# Patient Record
Sex: Male | Born: 1995 | Race: Black or African American | Hispanic: No | Marital: Single | Smoking: Never smoker
Health system: Southern US, Community
[De-identification: ages and names within clinical notes are randomized; demographics above are authoritative.]

---
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
## 2016-11-24 ENCOUNTER — Ambulatory Visit (HOSPITAL_COMMUNITY)
Admission: EM | Admit: 2016-11-24 | Discharge: 2016-11-24 | Disposition: A | Discharge status: Home / Self Care | Payer: Medicaid Other | Attending: Family Medicine | Admitting: Family Medicine

## 2016-11-24 ENCOUNTER — Encounter (HOSPITAL_COMMUNITY): Payer: Self-pay | Admitting: Emergency Medicine

## 2016-11-24 DIAGNOSIS — A5409 Other gonococcal infection of lower genitourinary tract: Secondary | ICD-10-CM | POA: Diagnosis not present

## 2016-11-24 DIAGNOSIS — R300 Dysuria: Secondary | ICD-10-CM | POA: Diagnosis present

## 2016-11-24 DIAGNOSIS — A64 Unspecified sexually transmitted disease: Secondary | ICD-10-CM

## 2016-11-24 DIAGNOSIS — R369 Urethral discharge, unspecified: Secondary | ICD-10-CM

## 2016-11-24 DIAGNOSIS — A5601 Chlamydial cystitis and urethritis: Secondary | ICD-10-CM | POA: Insufficient documentation

## 2016-11-24 LAB — POCT URINALYSIS DIP (DEVICE)
Bilirubin Urine: NEGATIVE
GLUCOSE, UA: NEGATIVE mg/dL
Hgb urine dipstick: NEGATIVE
Ketones, ur: NEGATIVE mg/dL
Leukocytes, UA: NEGATIVE
NITRITE: NEGATIVE
PROTEIN: NEGATIVE mg/dL
Specific Gravity, Urine: 1.01 (ref 1.005–1.030)
UROBILINOGEN UA: 0.2 mg/dL (ref 0.0–1.0)
pH: 7 (ref 5.0–8.0)

## 2016-11-24 MED ORDER — CEFTRIAXONE SODIUM 250 MG IJ SOLR
250.0000 mg | Freq: Once | INTRAMUSCULAR | Status: AC
  Administered 2016-11-24: 250 mg via INTRAMUSCULAR

## 2016-11-24 MED ORDER — LIDOCAINE HCL (PF) 1 % IJ SOLN
INTRAMUSCULAR | Status: AC
  Filled 2016-11-24: qty 2

## 2016-11-24 MED ORDER — AZITHROMYCIN 250 MG PO TABS
1000.0000 mg | ORAL_TABLET | Freq: Once | ORAL | Status: AC
  Administered 2016-11-24: 1000 mg via ORAL

## 2016-11-24 MED ORDER — AZITHROMYCIN 250 MG PO TABS
ORAL_TABLET | ORAL | Status: AC
  Filled 2016-11-24: qty 4

## 2016-11-24 MED ORDER — CEFTRIAXONE SODIUM 250 MG IJ SOLR
INTRAMUSCULAR | Status: AC
  Filled 2016-11-24: qty 250

## 2016-11-24 NOTE — Discharge Instructions (Signed)
Your urine has been collected and will be tested for gonorrhea and chlamydia. You are being treated tonight with medications that cover for those infections. Practice safe sex methods and I recommend following up with your primary care provider, the health department or returning to clinic in 1 week for retesting to ensure clearance of the infection.

## 2016-11-24 NOTE — ED Triage Notes (Signed)
[facility] with a complaint of abdominal pain, dysuria and a penile discharge that started today.

## 2016-11-24 NOTE — ED Notes (Signed)
Dirty and clean urine collected. 

## 2016-11-24 NOTE — ED Provider Notes (Signed)
CSN: [id number]     Arrival date & time 11/24/16  1734 History   First MD Initiated Contact with Patient 11/24/16 1815     Chief Complaint  Patient presents with  . Dysuria   (Consider location/radiation/quality/duration/timing/severity/associated sxs/prior Treatment) 21 year old male presents to clinic with a 1 day history of burning with urination and a thick yellow discharge coming from his penis. States he was last intimate with his partner two days ago, has not used condoms, denies fever, nausea, vomiting, but does have abdominal pain. He denies any lesions, irritation, or other symptoms on his genitals.      History reviewed. No pertinent past medical history. History reviewed. No pertinent surgical history. History reviewed. No pertinent family history. Social History  Substance Use Topics  . Smoking status: Never Smoker  . Smokeless tobacco: Not on file  . Alcohol use No    Review of Systems  Constitutional: Negative.   HENT: Negative.   Eyes: Negative.   Respiratory: Negative.   Cardiovascular: Negative.   Gastrointestinal: Positive for abdominal pain. Negative for diarrhea, nausea and vomiting.  Genitourinary: Positive for discharge and dysuria. Negative for flank pain, genital sores, penile pain, scrotal swelling, testicular pain and urgency.  Musculoskeletal: Negative.   Neurological: Negative.     Allergies  Patient has no known allergies.  Home Medications   Prior to Admission medications   Not on File   Meds Ordered and Administered this Visit   Medications  cefTRIAXone (ROCEPHIN) injection 250 mg (250 mg Intramuscular Given 11/24/16 1845)  azithromycin (ZITHROMAX) tablet 1,000 mg (1,000 mg Oral Given 11/24/16 1843)    BP 118/71 (BP Location: Right Arm)   Pulse 73   Temp 98.5 F (36.9 C) (Oral)   Resp 16   SpO2 98%  No data found.   Physical Exam  Constitutional: He is oriented to person, place, and time. He appears well-developed and  well-nourished. No distress.  HENT:  Head: Normocephalic.  Cardiovascular: Normal rate and regular rhythm.   Pulmonary/Chest: Effort normal and breath sounds normal.  Abdominal: Soft. Bowel sounds are normal. He exhibits no distension. There is no guarding.  Genitourinary:  Genitourinary Comments: Deferred at patient's request  Neurological: He is alert and oriented to person, place, and time.  Skin: Skin is warm and dry. Capillary refill takes less than 2 seconds. He is not diaphoretic.  Psychiatric: He has a normal mood and affect.  Nursing note and vitals reviewed.   Urgent Care Course   Clinical Course     Procedures (including critical care time)  Labs Review Labs Reviewed  POCT URINALYSIS DIP (DEVICE)  URINE CYTOLOGY ANCILLARY ONLY    Imaging Review No results found.   Visual Acuity Review  Right Eye Distance:   Left Eye Distance:   Bilateral Distance:    Right Eye Near:   Left Eye Near:    Bilateral Near:         MDM   1. STI (sexually transmitted infection)    Urine collected to test for GC/[place]. Patient treated in clinic based on symptoms with Azithromycin and Rocephin. Patient counseled to practice safe sex methods to follow up in one week at his provider, health department, or to return to clinic for retesting. Patient offered HIV screening and declined.      Dorena BodoLawrence Brylea Pita, NP 11/24/16 [phone]

## 2016-11-25 LAB — URINE CYTOLOGY ANCILLARY ONLY
Chlamydia: POSITIVE — AB
Neisseria Gonorrhea: POSITIVE — AB

## 2021-03-14 ENCOUNTER — Emergency Department (HOSPITAL_COMMUNITY)
Admission: EM | Admit: 2021-03-14 | Discharge: 2021-03-15 | Discharge status: Against Medical Advice | Payer: BLUE CROSS/BLUE SHIELD | Attending: Emergency Medicine | Admitting: Emergency Medicine

## 2021-03-14 ENCOUNTER — Encounter (HOSPITAL_COMMUNITY): Payer: Self-pay

## 2021-03-14 ENCOUNTER — Other Ambulatory Visit: Payer: Self-pay

## 2021-03-14 ENCOUNTER — Emergency Department (HOSPITAL_COMMUNITY): Payer: BLUE CROSS/BLUE SHIELD

## 2021-03-14 DIAGNOSIS — R0602 Shortness of breath: Secondary | ICD-10-CM | POA: Insufficient documentation

## 2021-03-14 DIAGNOSIS — R079 Chest pain, unspecified: Secondary | ICD-10-CM | POA: Diagnosis not present

## 2021-03-14 DIAGNOSIS — Z5321 Procedure and treatment not carried out due to patient leaving prior to being seen by health care provider: Secondary | ICD-10-CM | POA: Insufficient documentation

## 2021-03-14 LAB — TROPONIN I (HIGH SENSITIVITY): Troponin I (High Sensitivity): 3 ng/L (ref <18)

## 2021-03-14 LAB — CBC WITH DIFFERENTIAL/PLATELET
Abs Immature Granulocytes: 0.02 10*3/uL (ref 0.00–0.07)
Basophils Absolute: 0 10*3/uL (ref 0.0–0.1)
Basophils Relative: 0 %
Eosinophils Absolute: 0.3 10*3/uL (ref 0.0–0.5)
Eosinophils Relative: 3 %
HCT: 45.2 % (ref 39.0–52.0)
Hemoglobin: 15 g/dL (ref 13.0–17.0)
Immature Granulocytes: 0 %
Lymphocytes Relative: 38 %
Lymphs Abs: 3.4 10*3/uL (ref 0.7–4.0)
MCH: 28.8 pg (ref 26.0–34.0)
MCHC: 33.2 g/dL (ref 30.0–36.0)
MCV: 86.9 fL (ref 80.0–100.0)
Monocytes Absolute: 0.7 10*3/uL (ref 0.1–1.0)
Monocytes Relative: 8 %
Neutro Abs: 4.5 10*3/uL (ref 1.7–7.7)
Neutrophils Relative %: 51 %
Platelets: 222 10*3/uL (ref 150–400)
RBC: 5.2 MIL/uL (ref 4.22–5.81)
RDW: 12.5 % (ref 11.5–15.5)
WBC: 8.8 10*3/uL (ref 4.0–10.5)
nRBC: 0 % (ref 0.0–0.2)

## 2021-03-14 LAB — BASIC METABOLIC PANEL
Anion gap: 7 (ref 5–15)
BUN: 10 mg/dL (ref 6–20)
CO2: 28 mmol/L (ref 22–32)
Calcium: 9.3 mg/dL (ref 8.9–10.3)
Chloride: 105 mmol/L (ref 98–111)
Creatinine, Ser: 1.18 mg/dL (ref 0.61–1.24)
GFR, Estimated: >60 mL/min (ref >60)
Glucose, Bld: 85 mg/dL (ref 70–99)
Potassium: 4 mmol/L (ref 3.5–5.1)
Sodium: 140 mmol/L (ref 135–145)

## 2021-03-14 NOTE — ED Triage Notes (Signed)
Emergency Medicine Provider Triage Evaluation Note  Edwin Adams , a 25 y.o. male  was evaluated in triage.  Pt complains of right-sided chest pain.  Patient was 2 days patient reports pain is only present when he coughs, takes a deep breath, or uses nicotine vape.  Patient endorses associated shortness of breath.    Review of Systems  Positive: Right-sided chest pain,shob Negative: Fever, chills, nausea, nasal congestion, sore throat, nausea, vomiting diaphoresis, abdominal  Physical Exam  BP 137/75 (BP Location: Right Arm)   Pulse 88   Temp 99.2 F (37.3 C) (Oral)   Resp 17   Ht 6\' 2"  (1.88 m)   Wt 108.9 kg   SpO2 99%   BMI 30.81 kg/m  Gen:   Awake, no distress   HEENT:  Atraumatic  Resp:  Normal effort, lungs clear to auscultation, Cardiac:  Normal rate  MSK:   Moves extremities without difficulty  Neuro:  Speech clear   Medical Decision Making  Medically screening exam initiated at 9:04 PM.  Appropriate orders placed.  was informed that the remainder of the evaluation will be completed by another provider, this initial triage assessment does not replace that evaluation, and the importance of remaining in the ED until their evaluation is complete.  Clinical Impression   The patient appears stable so that the remainder of the work up may be completed by another provider.      Caleb Popp, Haskel Schroeder 03/14/21 2106

## 2021-03-14 NOTE — ED Triage Notes (Signed)
Patient reports chest pain and shortness of breath x a few days, R sided chest pain worse with coughing, states he does vape.

## 2021-03-15 NOTE — ED Notes (Signed)
Pt called for vitals no answer. °

## 2021-09-20 IMAGING — DX DG CHEST 2V
2 series · 2 of 2 positions shown · non-contrast
Comparison: None.

CLINICAL DATA: Right-sided chest pain, cough, and shortness of
breath for several days.

EXAM:
CHEST - 2 VIEW

[chest pa]
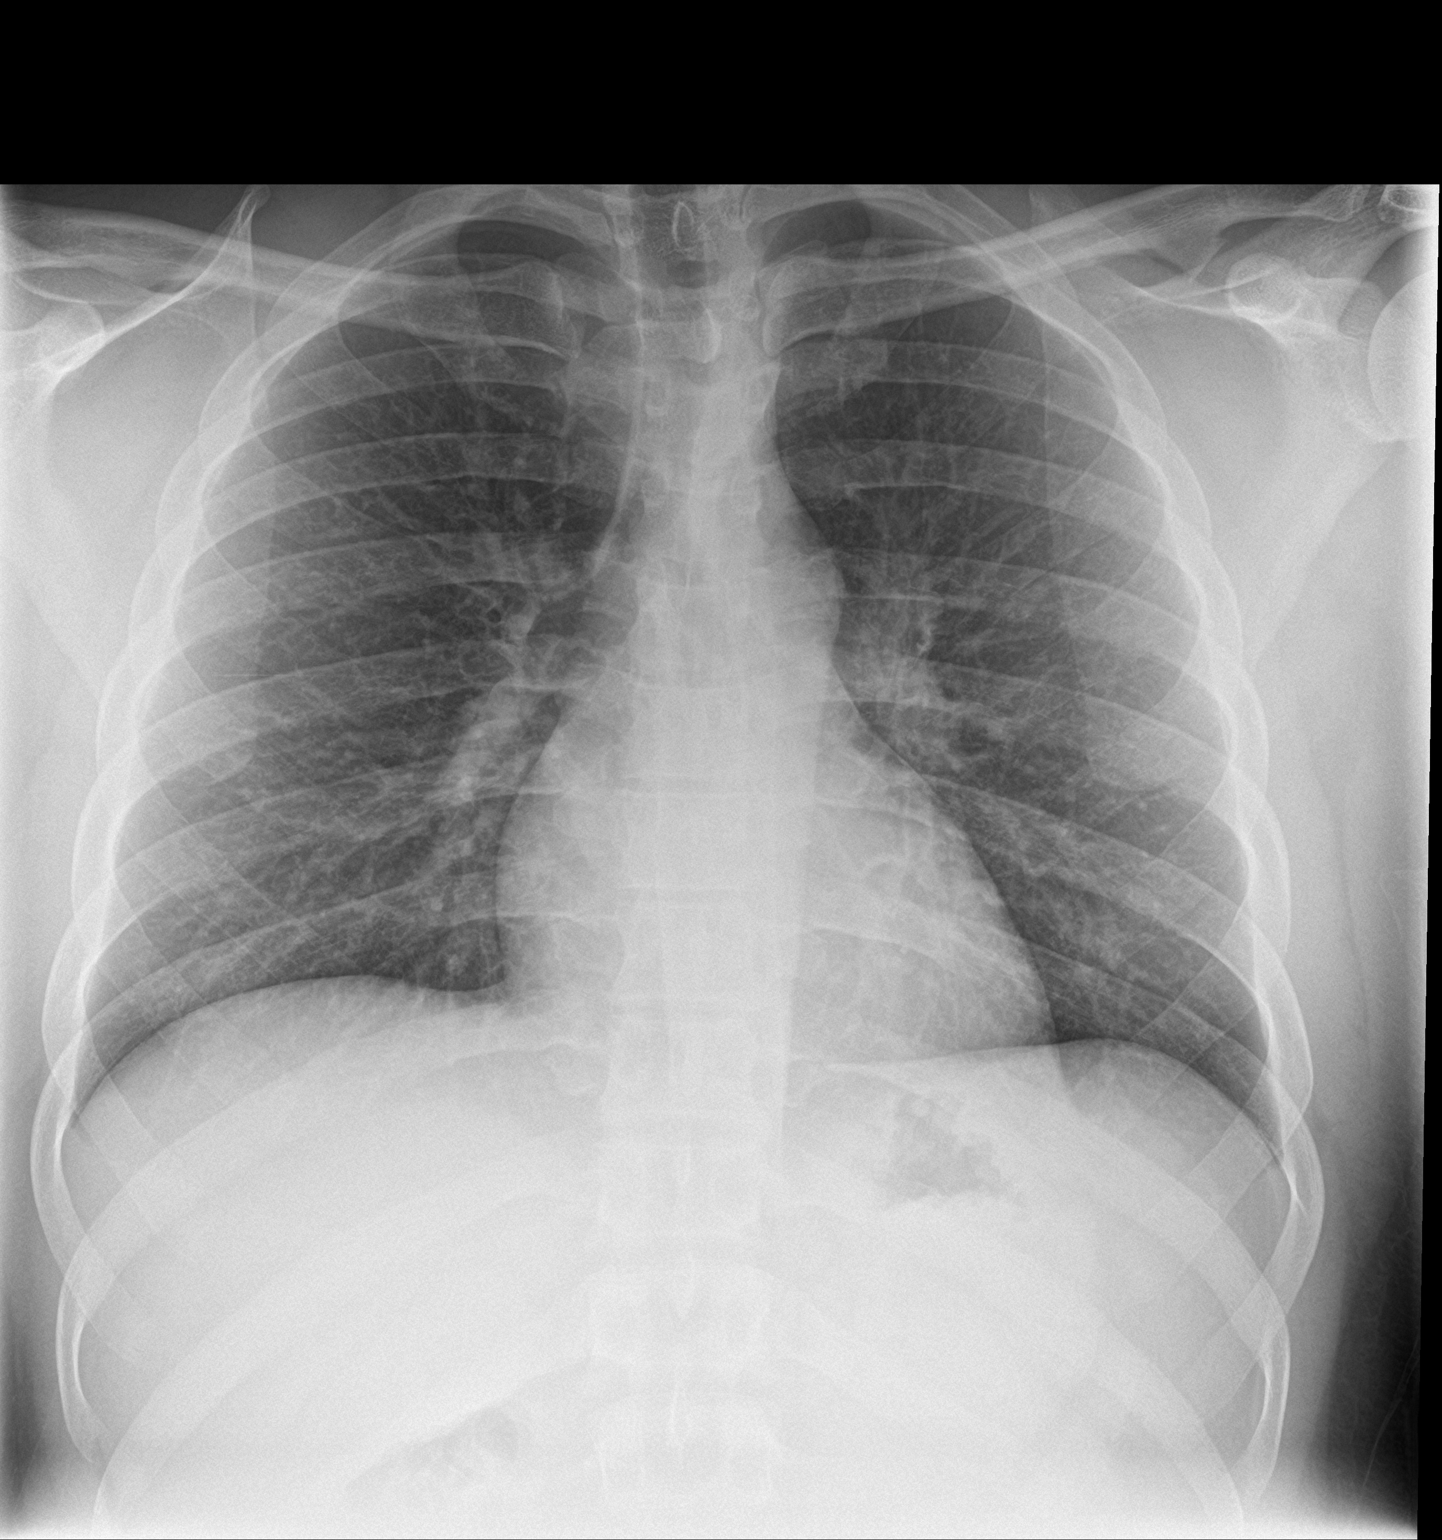

[chest lat]
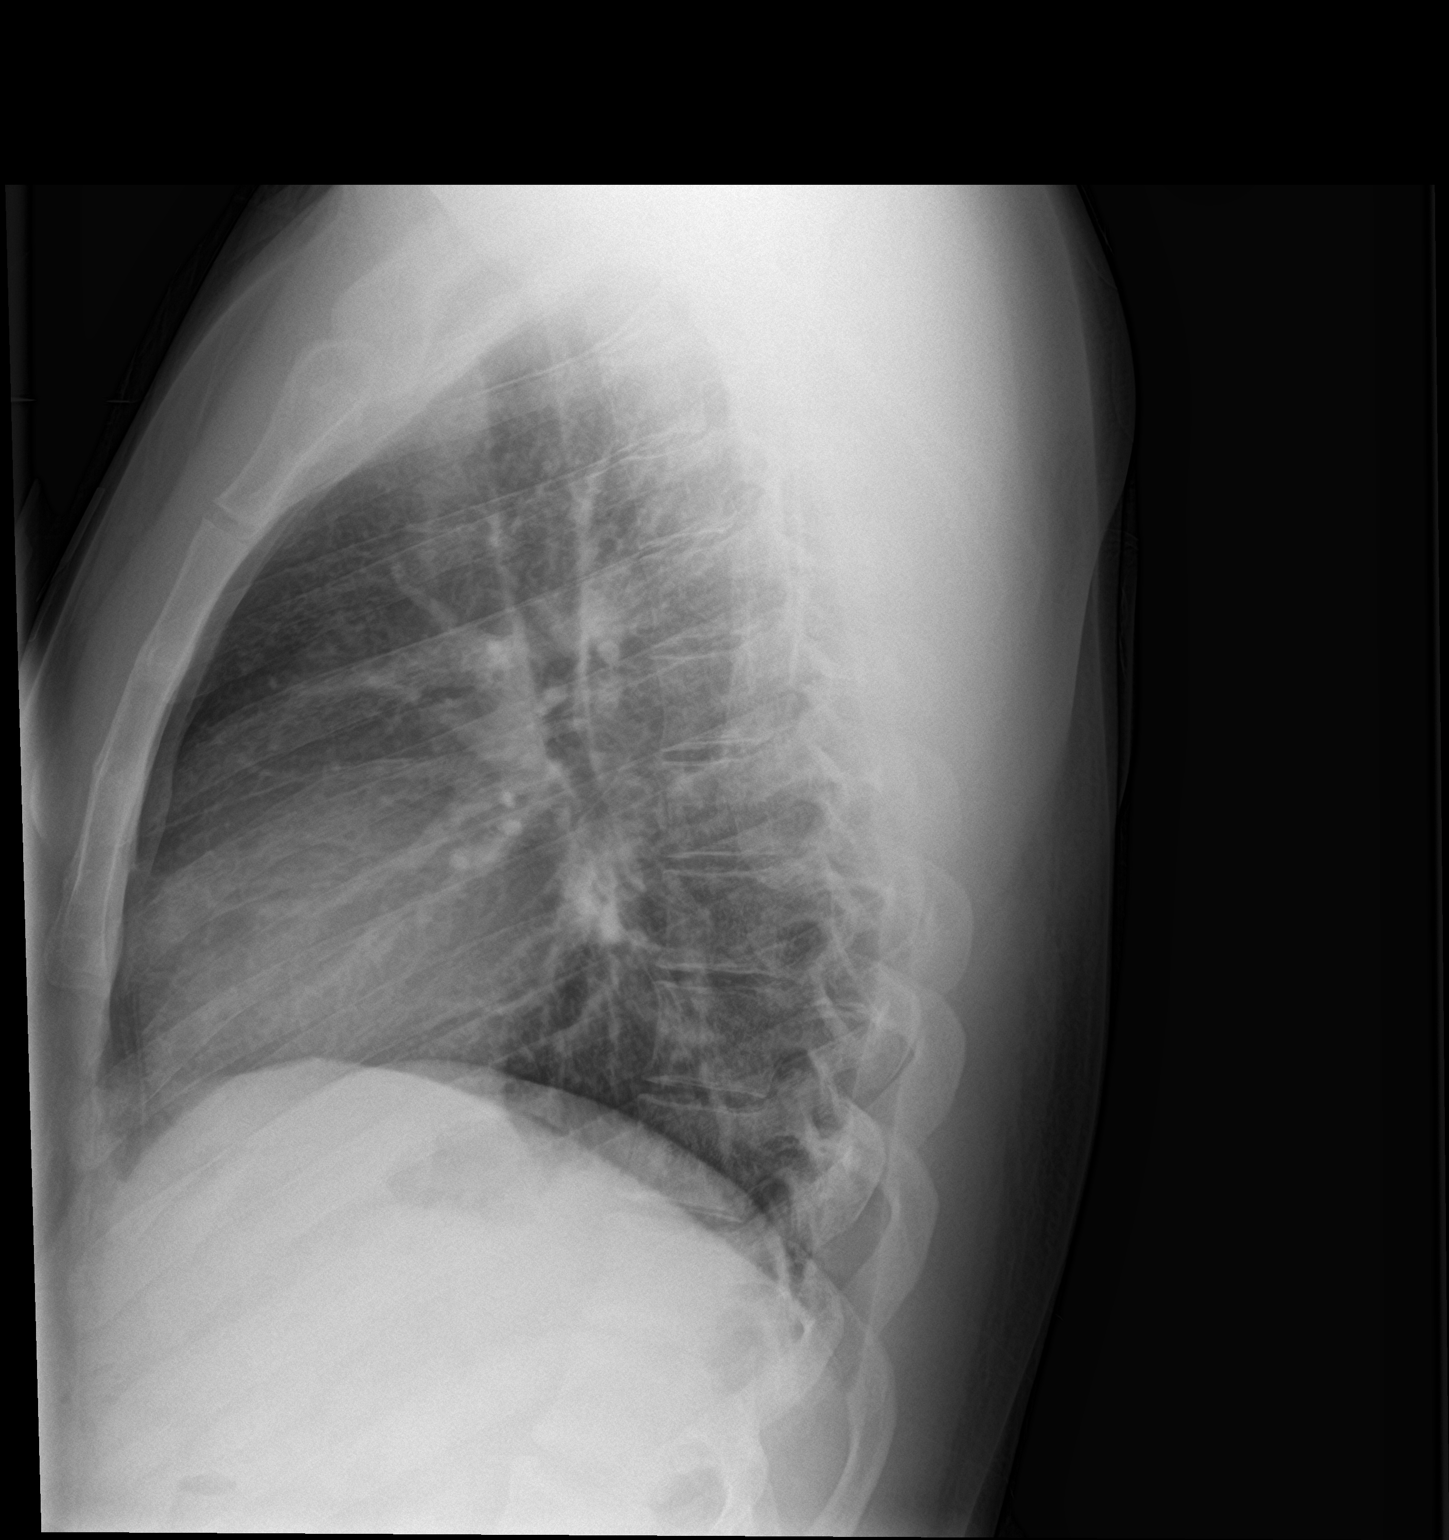

[2 of 2 positions shown; findings below may reference images not displayed]

FINDINGS: The heart size and mediastinal contours are within normal limits.
Both lungs are clear. No evidence of pneumothorax or pleural
effusion. The visualized skeletal structures are unremarkable.
IMPRESSION: Normal study.

## 2021-10-31 ENCOUNTER — Encounter (HOSPITAL_COMMUNITY): Payer: Self-pay

## 2021-10-31 ENCOUNTER — Telehealth (HOSPITAL_COMMUNITY): Payer: Self-pay

## 2021-10-31 ENCOUNTER — Ambulatory Visit (HOSPITAL_COMMUNITY)
Admission: EM | Admit: 2021-10-31 | Discharge: 2021-10-31 | Disposition: A | Discharge status: Home / Self Care | Payer: BC Managed Care – PPO | Attending: Urgent Care | Admitting: Urgent Care

## 2021-10-31 DIAGNOSIS — R369 Urethral discharge, unspecified: Secondary | ICD-10-CM | POA: Insufficient documentation

## 2021-10-31 DIAGNOSIS — Z7251 High risk heterosexual behavior: Secondary | ICD-10-CM

## 2021-10-31 MED ORDER — DOXYCYCLINE HYCLATE 100 MG PO CAPS: 100.0000 mg | ORAL_CAPSULE | Freq: Two times a day (BID) | ORAL | 0 refills | Status: DC

## 2021-10-31 MED ORDER — CEFTRIAXONE SODIUM 500 MG IJ SOLR
INTRAMUSCULAR | Status: AC
  Filled 2021-10-31: qty 500

## 2021-10-31 MED ORDER — CEFTRIAXONE SODIUM 500 MG IJ SOLR
500.0000 mg | Freq: Once | INTRAMUSCULAR | Status: AC
  Administered 2021-10-31: 500 mg via INTRAMUSCULAR

## 2021-10-31 MED ORDER — LIDOCAINE HCL (PF) 1 % IJ SOLN
INTRAMUSCULAR | Status: AC
  Filled 2021-10-31: qty 2

## 2021-10-31 NOTE — ED Triage Notes (Signed)
Pt c/o lower abdominal pain and penial discharge x2 days.

## 2021-10-31 NOTE — ED Provider Notes (Signed)
  [facility]   MRN: [id number] DOB: 11/03/1996  Subjective:   FODAY CONE is a 25 y.o. male presenting for 2-day history of acute onset penile discharge, lower abdominal pain. Had unprotected oral sex 3 days ago. Generally uses condoms for protection. Has several male partners. Denies dysuria, hematuria, urinary frequency, penile swelling, testicular pain, testicular swelling, anal pain, groin pain.   No current facility-administered medications for this encounter. No current outpatient medications on file.   No Known Allergies  History reviewed. No pertinent past medical history.   History reviewed. No pertinent surgical history.  History reviewed. No pertinent family history.  Social History   Tobacco Use   Smoking status: Never  Substance Use Topics   Alcohol use: No    ROS   Objective:   Vitals: BP 117/74 (BP Location: Right Arm)   Pulse 83   Temp 98.4 F (36.9 C) (Oral)   SpO2 100%   Physical Exam Constitutional:      General: He is not in acute distress.    Appearance: Normal appearance. He is well-developed and normal weight. He is not ill-appearing, toxic-appearing or diaphoretic.  HENT:     Head: Normocephalic and atraumatic.     Right Ear: External ear normal.     Left Ear: External ear normal.     Nose: Nose normal.     Mouth/Throat:     Pharynx: Oropharynx is clear.  Eyes:     General: No scleral icterus.       Right eye: No discharge.        Left eye: No discharge.     Extraocular Movements: Extraocular movements intact.     Pupils: Pupils are equal, round, and reactive to light.  Cardiovascular:     Rate and Rhythm: Normal rate.  Pulmonary:     Effort: Pulmonary effort is normal.  Genitourinary:    Penis: Circumcised. Discharge present.   Musculoskeletal:     Cervical back: Normal range of motion.  Neurological:     Mental Status: He is alert and oriented to person, place, and time.  Psychiatric:        Mood  and Affect: Mood normal.        Behavior: Behavior normal.        Thought Content: Thought content normal.        Judgment: Judgment normal.    Assessment and Plan :   PDMP not reviewed this encounter.  1. Penile discharge   2. Unprotected sex    Patient treated empirically as per CDC guidelines with IM ceftriaxone, doxycycline as an outpatient.  Labs pending.   Counseled on safe sex practices including abstaining for 1 week following treatment.  Counseled patient on potential for adverse effects with medications prescribed/recommended today, ER and return-to-clinic precautions discussed, patient verbalized understanding.    Wallis Bamberg, [place] 10/31/21 [phone]

## 2021-10-31 NOTE — Discharge Instructions (Addendum)
Avoid all forms of sexual intercourse (oral, vaginal, anal) for the next 7 days to avoid spreading/reinfecting or at least until we can see what kinds of infection results are positive. Return if symptoms worsen/do not resolve, you develop fever, abdominal pain, blood in your urine, or are re-exposed to a sexually transmitted infection (STI).  

## 2021-11-03 LAB — CYTOLOGY, (ORAL, ANAL, URETHRAL) ANCILLARY ONLY
Chlamydia: NEGATIVE
Comment: NEGATIVE
Comment: NEGATIVE
Comment: NORMAL
Neisseria Gonorrhea: NEGATIVE
Trichomonas: NEGATIVE

## 2022-02-21 ENCOUNTER — Ambulatory Visit
Admission: EM | Admit: 2022-02-21 | Discharge: 2022-02-21 | Disposition: A | Discharge status: Home / Self Care | Payer: Medicare Other | Attending: Family Medicine | Admitting: Family Medicine

## 2022-02-21 ENCOUNTER — Encounter: Payer: Self-pay | Admitting: Emergency Medicine

## 2022-02-21 DIAGNOSIS — Z113 Encounter for screening for infections with a predominantly sexual mode of transmission: Secondary | ICD-10-CM

## 2022-02-21 DIAGNOSIS — N342 Other urethritis: Secondary | ICD-10-CM

## 2022-02-21 DIAGNOSIS — R369 Urethral discharge, unspecified: Secondary | ICD-10-CM

## 2022-02-21 DIAGNOSIS — Z711 Person with feared health complaint in whom no diagnosis is made: Secondary | ICD-10-CM | POA: Insufficient documentation

## 2022-02-21 MED ORDER — CEFTRIAXONE SODIUM 500 MG IJ SOLR
500.0000 mg | Freq: Once | INTRAMUSCULAR | Status: AC
  Administered 2022-02-21: 500 mg via INTRAMUSCULAR

## 2022-02-21 NOTE — ED Triage Notes (Signed)
Pt presents with testicular pain and penile discharge x 3 days.  ?

## 2022-02-21 NOTE — Discharge Instructions (Addendum)
You were treated for urethral infection with an injection of Rocephin.  ?We will notify you if your STD screen is abnormal and warrants any additional treatment. ?

## 2022-02-21 NOTE — ED Provider Notes (Signed)
?[facility] ? ? ? ?CSN: [id number] ?Arrival date & time: 02/21/22  3299 ? ? ?  ? ?History   ?Chief Complaint ?Chief Complaint  ?Patient presents with  ? Penile Discharge  ? Testicle Pain  ? ? ?HPI ?Edwin Adams is a 26 y.o. male.  ? ?HPI ?Patient presents today with testicular pain and penile discharge for 3 days. ?No known STD exposure. Similar symptoms in December STD screen was negative.  Patient denies any recent unprotected sex.  He did have sex 3 days ago but reports use of a condom.  He endorses that the discharge from his penis is yellow and thick.  He is free of fever, nausea, vomiting.  He denies any lesions. ? ?History reviewed. No pertinent past medical history. ? ?There are no problems to display for this patient. ? ? ?History reviewed. No pertinent surgical history. ? ? ? ? ?Home Medications   ? ?Prior to Admission medications   ?Medication Sig Start Date End Date Taking? Authorizing Provider  ?doxycycline (VIBRAMYCIN) 100 MG capsule Take 1 capsule (100 mg total) by mouth 2 (two) times daily. 10/31/21   Wallis Bamberg, PA-C  ? ? ?Family History ?History reviewed. No pertinent family history. ? ?Social History ?Social History  ? ?Tobacco Use  ? Smoking status: Never  ? Smokeless tobacco: Never  ?Vaping Use  ? Vaping Use: Every day  ?Substance Use Topics  ? Alcohol use: No  ? Drug use: Never  ? ? ? ?Allergies   ?Patient has no known allergies. ? ? ?Review of Systems ?Review of Systems ?Pertinent negatives listed in HPI  ?Physical Exam ?Triage Vital Signs ?ED Triage Vitals  ?Enc Vitals Group  ?   BP 02/21/22 0843 130/88  ?   Pulse Rate 02/21/22 0843 78  ?   Resp 02/21/22 0843 18  ?   Temp 02/21/22 0843 98.8 ?F (37.1 ?C)  ?   Temp Source 02/21/22 0843 Oral  ?   SpO2 02/21/22 0843 98 %  ?   Weight --   ?   Height --   ?   Head Circumference --   ?   Peak Flow --   ?   Pain Score 02/21/22 0841 0  ?   Pain Loc --   ?   Pain Edu? --   ?   Excl. in GC? --   ? ?No data found. ? ?Updated Vital Signs ?BP  130/88 (BP Location: Left Arm)   Pulse 78   Temp 98.8 ?F (37.1 ?C) (Oral)   Resp 18   SpO2 98%  ? ?Visual Acuity ?Right Eye Distance:   ?Left Eye Distance:   ?Bilateral Distance:   ? ?Right Eye Near:   ?Left Eye Near:    ?Bilateral Near:    ? ?Physical Exam ?Constitutional: Patient appears well-developed and well-nourished. No distress. ?HENT: Normocephalic, atraumatic, External right and left ear normal. Oropharynx is clear and moist.  ?Eyes: Conjunctivae and EOM are normal. PERRLA, no scleral icterus. ?CVS: RRR, S1/S2 +, no murmurs, no gallops, no carotid bruit.  ?Pulmonary: Effort and breath sounds normal, no stridor, rhonchi, wheezes, rales.  ?Neuro: Alert. Normal reflexes, muscle tone coordination. No cranial nerve deficit. ?Skin: Skin is warm and dry. No rash noted. Not diaphoretic. No erythema. No pallor. ?Urethral cytology self collected. ?UC Treatments / Results  ?Labs ?(all labs ordered are listed, but only abnormal results are displayed) ?Labs Reviewed  ?CYTOLOGY, (ORAL, ANAL, URETHRAL) ANCILLARY ONLY  ? ? ?EKG ? ? ?  Radiology ?No results found. ? ?Procedures ?Procedures (including critical care time) ? ?Medications Ordered in UC ?Medications - No data to display ? ?Initial Impression / Assessment and Plan / UC Course  ?I have reviewed the triage vital signs and the nursing notes. ? ?Pertinent labs & imaging results that were available during my care of the patient were reviewed by me and considered in my medical decision making (see chart for details). ? ?  ?Treating for urethritis due to abnormal penile discharge.  Patient was insistent that he needed oral antibiotic coverage however given his last STD test were all negative in December.  And even today patient denies any known exposure.  I will cover him with Rocephin 500 mg to cover for urethritis.  Will await STD panel results before any additional treatment is warranted.  Patient declined HIV and RPR testing ?Final Clinical Impressions(s) / UC  Diagnoses  ? ?Final diagnoses:  ?Abnormal penile discharge  ?Concern about STD in male without diagnosis  ?Pain in both testicles  ?Urethritis  ? ? ? ?Discharge Instructions   ? ?  ?I have referred you to see a urologist for further work-up and evaluation of your testicular pain. ?You were treated for urethral infection with an injection of Rocephin.  ?We will notify you if your STD screen is abnormal and warrants any additional treatment ? ? ? ?ED Prescriptions   ?None ?  ? ?PDMP not reviewed this encounter. ?  ?Bing Neighbors, FNP ?02/21/22 [phone] ? ?

## 2022-02-23 LAB — CYTOLOGY, (ORAL, ANAL, URETHRAL) ANCILLARY ONLY
Chlamydia: NEGATIVE
Comment: NEGATIVE
Comment: NEGATIVE
Comment: NORMAL
Neisseria Gonorrhea: NEGATIVE
Trichomonas: NEGATIVE

## 2022-02-24 ENCOUNTER — Encounter: Payer: Self-pay | Admitting: Emergency Medicine

## 2022-02-24 ENCOUNTER — Ambulatory Visit
Admission: EM | Admit: 2022-02-24 | Discharge: 2022-02-24 | Disposition: A | Discharge status: Home / Self Care | Payer: Self-pay | Attending: Internal Medicine | Admitting: Internal Medicine

## 2022-02-24 ENCOUNTER — Other Ambulatory Visit: Payer: Self-pay

## 2022-02-24 DIAGNOSIS — R369 Urethral discharge, unspecified: Secondary | ICD-10-CM | POA: Insufficient documentation

## 2022-02-24 DIAGNOSIS — Z113 Encounter for screening for infections with a predominantly sexual mode of transmission: Secondary | ICD-10-CM | POA: Insufficient documentation

## 2022-02-24 MED ORDER — DOXYCYCLINE HYCLATE 100 MG PO CAPS: 100.0000 mg | ORAL_CAPSULE | Freq: Two times a day (BID) | ORAL | 0 refills | Status: AC

## 2022-02-24 NOTE — Discharge Instructions (Signed)
Penile swab is pending.  We will call if it is positive.  Please refrain from sexual activity until test results and treatment are complete.  You have been prescribed doxycycline antibiotic. ?

## 2022-02-24 NOTE — ED Provider Notes (Signed)
?[facility] ? ? ? ?CSN: [id number] ?Arrival date & time: 02/24/22  0930 ? ? ?  ? ?History   ?Chief Complaint ?Chief Complaint  ?Patient presents with  ? Exposure to STD  ? ? ?HPI ?Edwin Adams is a 26 y.o. male.  ? ?Patient presents with penile discharge that has been present over the past few days.  Patient presented to a different urgent care for same symptoms on 02/21/2022 and a cytology swab was sent that was negative.  Patient was given Rocephin IM in urgent care with some improvement but not resolution of symptoms.  Denies any associated testicular pain currently, urinary burning, urinary frequency, back pain, fever, stomach pain.  Patient reports that he thinks that he was exposed to an STD from a sexual partner as she is having similar symptoms but no confirmed STD.  Patient is requesting doxycycline as he has had this previously with resolution of symptoms. ? ? ?Exposure to STD ? ? ?History reviewed. No pertinent past medical history. ? ?There are no problems to display for this patient. ? ? ?History reviewed. No pertinent surgical history. ? ? ? ? ?Home Medications   ? ?Prior to Admission medications   ?Medication Sig Start Date End Date Taking? Authorizing Provider  ?doxycycline (VIBRAMYCIN) 100 MG capsule Take 1 capsule (100 mg total) by mouth 2 (two) times daily. 02/24/22  Yes Gustavus Bryant, FNP  ? ? ?Family History ?History reviewed. No pertinent family history. ? ?Social History ?Social History  ? ?Tobacco Use  ? Smoking status: Never  ? Smokeless tobacco: Never  ?Vaping Use  ? Vaping Use: Every day  ?Substance Use Topics  ? Alcohol use: No  ? Drug use: Never  ? ? ? ?Allergies   ?Patient has no known allergies. ? ? ?Review of Systems ?Review of Systems ?Per HPI ? ?Physical Exam ?Triage Vital Signs ?ED Triage Vitals  ?Enc Vitals Group  ?   BP 02/24/22 1002 (!) 145/89  ?   Pulse Rate 02/24/22 1002 73  ?   Resp 02/24/22 1002 18  ?   Temp 02/24/22 1002 98.6 ?F (37 ?C)  ?   Temp Source 02/24/22  1002 Oral  ?   SpO2 02/24/22 1002 98 %  ?   Weight --   ?   Height --   ?   Head Circumference --   ?   Peak Flow --   ?   Pain Score 02/24/22 1003 0  ?   Pain Loc --   ?   Pain Edu? --   ?   Excl. in GC? --   ? ?No data found. ? ?Updated Vital Signs ?BP (!) 145/89 (BP Location: Left Arm)   Pulse 73   Temp 98.6 ?F (37 ?C) (Oral)   Resp 18   SpO2 98%  ? ?Visual Acuity ?Right Eye Distance:   ?Left Eye Distance:   ?Bilateral Distance:   ? ?Right Eye Near:   ?Left Eye Near:    ?Bilateral Near:    ? ?Physical Exam ?Constitutional:   ?   General: He is not in acute distress. ?   Appearance: Normal appearance. He is not toxic-appearing or diaphoretic.  ?HENT:  ?   Head: Normocephalic and atraumatic.  ?Eyes:  ?   Extraocular Movements: Extraocular movements intact.  ?   Conjunctiva/sclera: Conjunctivae normal.  ?Pulmonary:  ?   Effort: Pulmonary effort is normal.  ?Genitourinary: ?   Comments: Patient declined.  Self swab performed ?  Neurological:  ?   General: No focal deficit present.  ?   Mental Status: He is alert and oriented to person, place, and time. Mental status is at baseline.  ?Psychiatric:     ?   Mood and Affect: Mood normal.     ?   Behavior: Behavior normal.     ?   Thought Content: Thought content normal.     ?   Judgment: Judgment normal.  ? ? ? ?UC Treatments / Results  ?Labs ?(all labs ordered are listed, but only abnormal results are displayed) ?Labs Reviewed  ?CYTOLOGY, (ORAL, ANAL, URETHRAL) ANCILLARY ONLY  ? ? ?EKG ? ? ?Radiology ?No results found. ? ?Procedures ?Procedures (including critical care time) ? ?Medications Ordered in UC ?Medications - No data to display ? ?Initial Impression / Assessment and Plan / UC Course  ?I have reviewed the triage vital signs and the nursing notes. ? ?Pertinent labs & imaging results that were available during my care of the patient were reviewed by me and considered in my medical decision making (see chart for details). ? ?  ? ?Repeat cytology swab pending.   Patient refused for me to do the cytology swab. After further review of the chart, patient has had negative cytologies in the past but reports that doxycycline has cleared up symptoms.  Will opt to treat with doxycycline as patient received IM Rocephin at most recent urgent care visit for same symptoms.  Patient advised to follow-up if symptoms persist or worsen despite antibiotic treatment.  Patient was also advised of the risk of antibiotic resistance.  Patient to refrain from sexual activity until test results and treatment are complete.  Patient verbalized understanding and was agreeable with plan. ?Final Clinical Impressions(s) / UC Diagnoses  ? ?Final diagnoses:  ?Penile discharge  ?Screening examination for venereal disease  ? ? ? ?Discharge Instructions   ? ?  ?Penile swab is pending.  We will call if it is positive.  Please refrain from sexual activity until test results and treatment are complete.  You have been prescribed doxycycline antibiotic. ? ? ? ?ED Prescriptions   ? ? Medication Sig Dispense Auth. Provider  ? doxycycline (VIBRAMYCIN) 100 MG capsule Take 1 capsule (100 mg total) by mouth 2 (two) times daily. 20 capsule Ervin Knack E, [place]  ? ?  ? ?PDMP not reviewed this encounter. ?  ?Gustavus Bryant, [place] ?02/24/22 1017 ? ?

## 2022-02-24 NOTE — ED Triage Notes (Signed)
Pt here for continued penile discharge since last visit; pt sts he feels like he didn't get a good swab last time and he usually needs doxycycline  ?

## 2022-02-25 LAB — CYTOLOGY, (ORAL, ANAL, URETHRAL) ANCILLARY ONLY
Chlamydia: NEGATIVE
Comment: NEGATIVE
Comment: NEGATIVE
Comment: NORMAL
Neisseria Gonorrhea: NEGATIVE
Trichomonas: NEGATIVE
